# Patient Record
Sex: Female | Born: 1975 | Race: White | Hispanic: No | Marital: Single
Health system: Southern US, Community
[De-identification: ages and names within clinical notes are randomized; demographics above are authoritative.]

## PROBLEM LIST (undated history)

## (undated) DIAGNOSIS — I10 Essential (primary) hypertension: Secondary | ICD-10-CM

---

## 2018-04-26 ENCOUNTER — Emergency Department (HOSPITAL_COMMUNITY): Payer: BLUE CROSS/BLUE SHIELD

## 2018-04-26 ENCOUNTER — Emergency Department (HOSPITAL_COMMUNITY)
Admission: EM | Admit: 2018-04-26 | Discharge: 2018-04-26 | Disposition: A | Discharge status: Home / Self Care | Payer: BLUE CROSS/BLUE SHIELD | Attending: Emergency Medicine | Admitting: Emergency Medicine

## 2018-04-26 ENCOUNTER — Encounter (HOSPITAL_COMMUNITY): Payer: Self-pay | Admitting: Internal Medicine

## 2018-04-26 DIAGNOSIS — T07XXXA Unspecified multiple injuries, initial encounter: Secondary | ICD-10-CM

## 2018-04-26 DIAGNOSIS — S52502A Unspecified fracture of the lower end of left radius, initial encounter for closed fracture: Secondary | ICD-10-CM | POA: Diagnosis not present

## 2018-04-26 DIAGNOSIS — S52612A Displaced fracture of left ulna styloid process, initial encounter for closed fracture: Secondary | ICD-10-CM | POA: Diagnosis not present

## 2018-04-26 DIAGNOSIS — Y929 Unspecified place or not applicable: Secondary | ICD-10-CM | POA: Insufficient documentation

## 2018-04-26 DIAGNOSIS — S8002XA Contusion of left knee, initial encounter: Secondary | ICD-10-CM

## 2018-04-26 DIAGNOSIS — Y999 Unspecified external cause status: Secondary | ICD-10-CM | POA: Diagnosis not present

## 2018-04-26 DIAGNOSIS — Y939 Activity, unspecified: Secondary | ICD-10-CM | POA: Insufficient documentation

## 2018-04-26 DIAGNOSIS — S6992XA Unspecified injury of left wrist, hand and finger(s), initial encounter: Secondary | ICD-10-CM | POA: Diagnosis present

## 2018-04-26 DIAGNOSIS — S62102A Fracture of unspecified carpal bone, left wrist, initial encounter for closed fracture: Secondary | ICD-10-CM

## 2018-04-26 HISTORY — DX: Essential (primary) hypertension: I10

## 2018-04-26 LAB — CBC WITH DIFFERENTIAL/PLATELET
Abs Immature Granulocytes: 0.11 10*3/uL — ABNORMAL HIGH (ref 0.00–0.07)
Basophils Absolute: 0.1 10*3/uL (ref 0.0–0.1)
Basophils Relative: 0 %
Eosinophils Absolute: 0.1 10*3/uL (ref 0.0–0.5)
Eosinophils Relative: 1 %
HEMATOCRIT: 46.7 % — AB (ref 36.0–46.0)
Hemoglobin: 15.2 g/dL — ABNORMAL HIGH (ref 12.0–15.0)
Immature Granulocytes: 1 %
Lymphocytes Relative: 14 %
Lymphs Abs: 2.3 10*3/uL (ref 0.7–4.0)
MCH: 31.3 pg (ref 26.0–34.0)
MCHC: 32.5 g/dL (ref 30.0–36.0)
MCV: 96.3 fL (ref 80.0–100.0)
Monocytes Absolute: 0.8 10*3/uL (ref 0.1–1.0)
Monocytes Relative: 5 %
Neutro Abs: 13.2 10*3/uL — ABNORMAL HIGH (ref 1.7–7.7)
Neutrophils Relative %: 79 %
Platelets: 187 10*3/uL (ref 150–400)
RBC: 4.85 MIL/uL (ref 3.87–5.11)
RDW: 12.5 % (ref 11.5–15.5)
WBC: 16.5 10*3/uL — ABNORMAL HIGH (ref 4.0–10.5)
nRBC: 0 % (ref 0.0–0.2)

## 2018-04-26 LAB — BASIC METABOLIC PANEL
ANION GAP: 11 (ref 5–15)
BUN: 14 mg/dL (ref 6–20)
CALCIUM: 9.5 mg/dL (ref 8.9–10.3)
CO2: 25 mmol/L (ref 22–32)
Chloride: 99 mmol/L (ref 98–111)
Creatinine, Ser: 0.91 mg/dL (ref 0.44–1.00)
GFR calc non Af Amer: >60 mL/min (ref >60)
Glucose, Bld: 156 mg/dL — ABNORMAL HIGH (ref 70–99)
Potassium: 3.5 mmol/L (ref 3.5–5.1)
Sodium: 135 mmol/L (ref 135–145)

## 2018-04-26 LAB — I-STAT BETA HCG BLOOD, ED (MC, WL, AP ONLY): I-stat hCG, quantitative: <5 m[IU]/mL (ref <5)

## 2018-04-26 MED ORDER — MORPHINE SULFATE (PF) 4 MG/ML IV SOLN: 4.0000 mg | Freq: Once | INTRAVENOUS | Status: DC

## 2018-04-26 MED ORDER — ONDANSETRON 4 MG PO TBDP
8.0000 mg | ORAL_TABLET | Freq: Once | ORAL | Status: AC
  Administered 2018-04-26: 8 mg via ORAL
  Filled 2018-04-26: qty 2

## 2018-04-26 MED ORDER — ONDANSETRON HCL 8 MG PO TABS: 8.0000 mg | ORAL_TABLET | Freq: Three times a day (TID) | ORAL | 0 refills | Status: AC | PRN

## 2018-04-26 MED ORDER — HYDROCODONE-ACETAMINOPHEN 5-325 MG PO TABS: 1.0000 | ORAL_TABLET | ORAL | 0 refills | Status: AC | PRN

## 2018-04-26 MED ORDER — HYDROCODONE-ACETAMINOPHEN 5-325 MG PO TABS
2.0000 | ORAL_TABLET | Freq: Once | ORAL | Status: AC
  Administered 2018-04-26: 2 via ORAL
  Filled 2018-04-26: qty 2

## 2018-04-26 MED ORDER — MORPHINE SULFATE (PF) 4 MG/ML IV SOLN
4.0000 mg | Freq: Once | INTRAVENOUS | Status: AC
  Administered 2018-04-26: 4 mg via INTRAVENOUS
  Filled 2018-04-26: qty 1

## 2018-04-26 NOTE — ED Notes (Signed)
ED Provider at bedside. 

## 2018-04-26 NOTE — ED Notes (Signed)
Patient Alert and oriented to baseline. Stable and ambulatory to baseline. Patient verbalized understanding of the discharge instructions.  Patient belongings were taken by the patient.   
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
## 2018-04-26 NOTE — Progress Notes (Signed)
Orthopedic Tech Progress Note Patient Details:  Latasha Kelly 12-31-1975 [id number]  Ortho Devices Type of Ortho Device: Arm sling, Sugartong splint Ortho Device/Splint Location: lue Ortho Device/Splint Interventions: Ordered, Application, Adjustment   Post Interventions Patient Tolerated: Well Instructions Provided: Care of device, Adjustment of device   Trinna Post 04/26/2018, 9:03 PM

## 2018-04-26 NOTE — ED Notes (Signed)
Patient transported to X-ray 

## 2019-03-29 ENCOUNTER — Other Ambulatory Visit: Payer: Self-pay | Admitting: Obstetrics and Gynecology

## 2019-03-29 DIAGNOSIS — N63 Unspecified lump in unspecified breast: Secondary | ICD-10-CM

## 2019-04-17 ENCOUNTER — Ambulatory Visit
Admission: RE | Admit: 2019-04-17 | Discharge: 2019-04-17 | Disposition: A | Discharge status: Home / Self Care | Payer: BC Managed Care – PPO | Source: Ambulatory Visit | Attending: Obstetrics and Gynecology | Admitting: Obstetrics and Gynecology

## 2019-04-17 ENCOUNTER — Other Ambulatory Visit: Payer: Self-pay

## 2019-04-17 ENCOUNTER — Other Ambulatory Visit: Payer: Self-pay | Admitting: Obstetrics and Gynecology

## 2019-04-17 ENCOUNTER — Ambulatory Visit: Payer: Self-pay

## 2019-04-17 DIAGNOSIS — R928 Other abnormal and inconclusive findings on diagnostic imaging of breast: Secondary | ICD-10-CM

## 2019-04-17 DIAGNOSIS — N6320 Unspecified lump in the left breast, unspecified quadrant: Secondary | ICD-10-CM

## 2019-10-17 ENCOUNTER — Ambulatory Visit
Admission: RE | Admit: 2019-10-17 | Discharge: 2019-10-17 | Disposition: A | Discharge status: Home / Self Care | Payer: BC Managed Care – PPO | Source: Ambulatory Visit | Attending: Obstetrics and Gynecology | Admitting: Obstetrics and Gynecology

## 2019-10-17 ENCOUNTER — Other Ambulatory Visit: Payer: Self-pay | Admitting: Obstetrics and Gynecology

## 2019-10-17 ENCOUNTER — Other Ambulatory Visit: Payer: Self-pay

## 2019-10-17 DIAGNOSIS — N6320 Unspecified lump in the left breast, unspecified quadrant: Secondary | ICD-10-CM

## 2020-04-19 ENCOUNTER — Other Ambulatory Visit: Payer: Self-pay

## 2020-04-19 ENCOUNTER — Ambulatory Visit: Payer: BC Managed Care – PPO

## 2020-04-19 ENCOUNTER — Ambulatory Visit
Admission: RE | Admit: 2020-04-19 | Discharge: 2020-04-19 | Disposition: A | Discharge status: Home / Self Care | Payer: 59 | Source: Ambulatory Visit | Attending: Obstetrics and Gynecology | Admitting: Obstetrics and Gynecology

## 2020-04-19 DIAGNOSIS — N6320 Unspecified lump in the left breast, unspecified quadrant: Secondary | ICD-10-CM

## 2021-11-17 IMAGING — MG DIGITAL DIAGNOSTIC BILAT W/ TOMO W/ CAD
8 series · 8 of 24 positions shown · non-contrast
Comparison: Baseline screening mammogram dated 03/15/2019, LEFT
breast diagnostic mammogram and ultrasound dated 04/17/2019, and
LEFT breast ultrasound dated 10/17/2019.

CLINICAL DATA: Follow-up for probably benign mass in the LEFT
breast. This probably benign mass was initially identified on
baseline screening mammogram dated 03/15/2019.

EXAM:
DIGITAL DIAGNOSTIC BILATERAL MAMMOGRAM WITH TOMO AND CAD

[R MLO synth-2D]
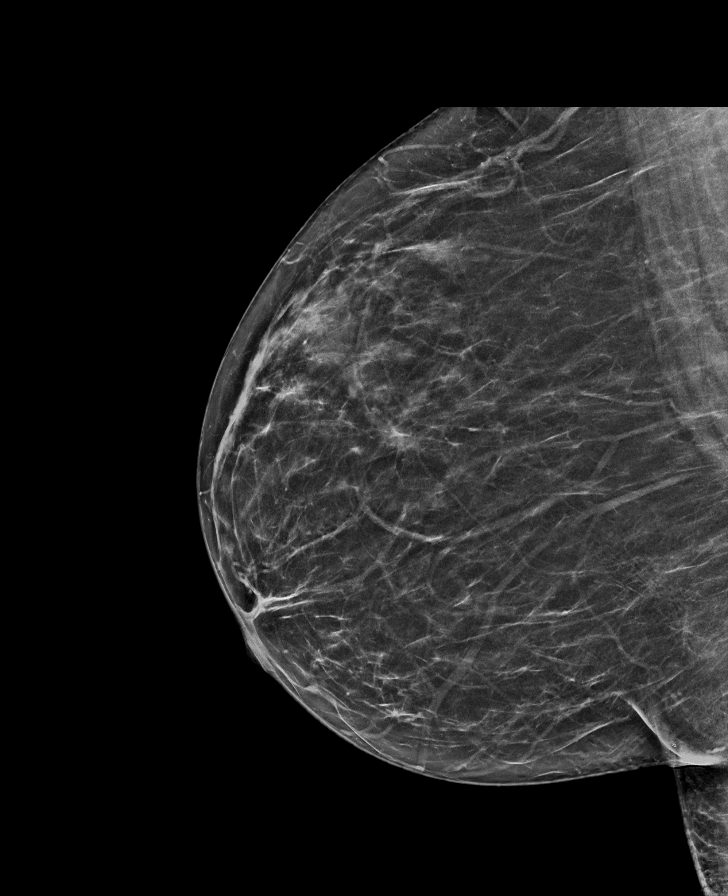

[L MLO synth-2D]
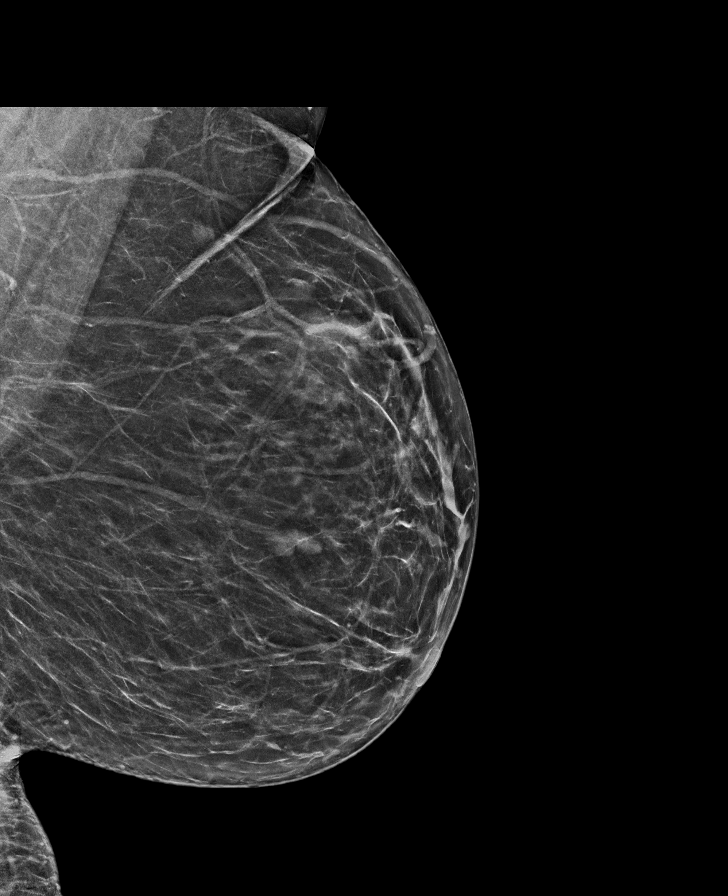

[R CC synth-2D]
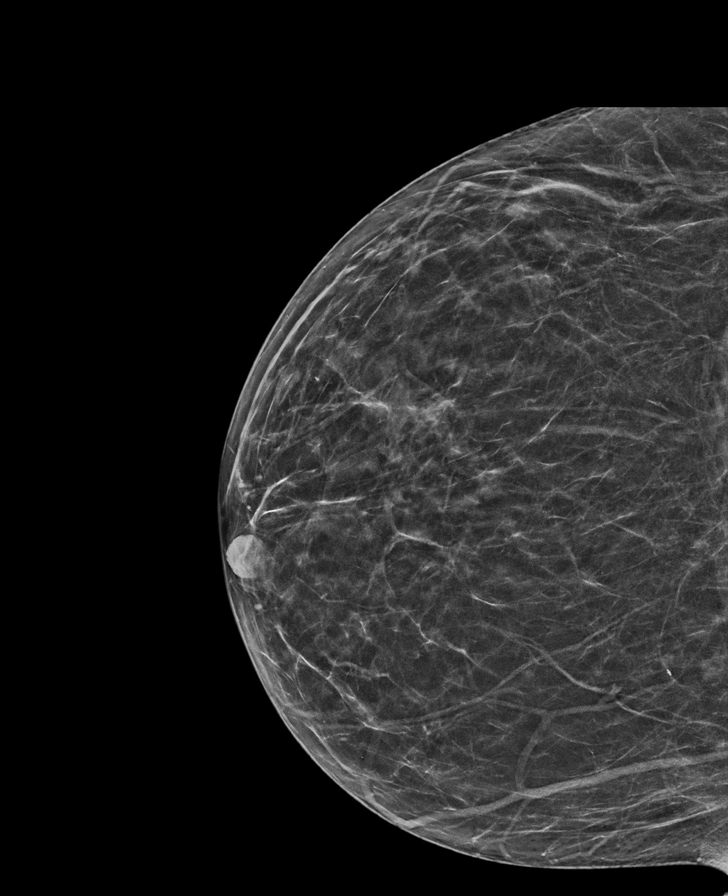

[L CC synth-2D]
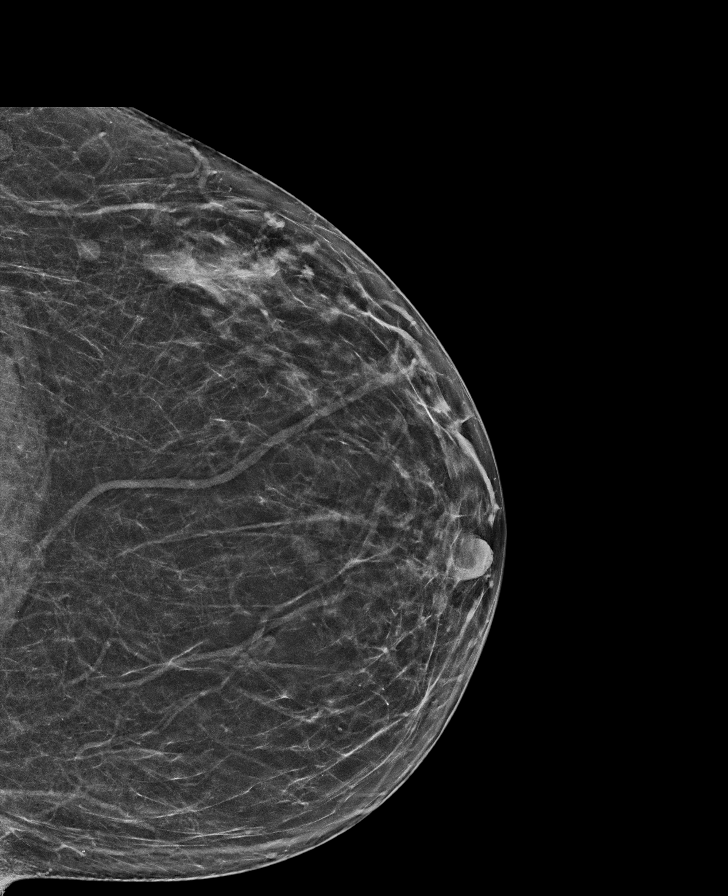

[R CC tomo · tomo slice 31/62.0]
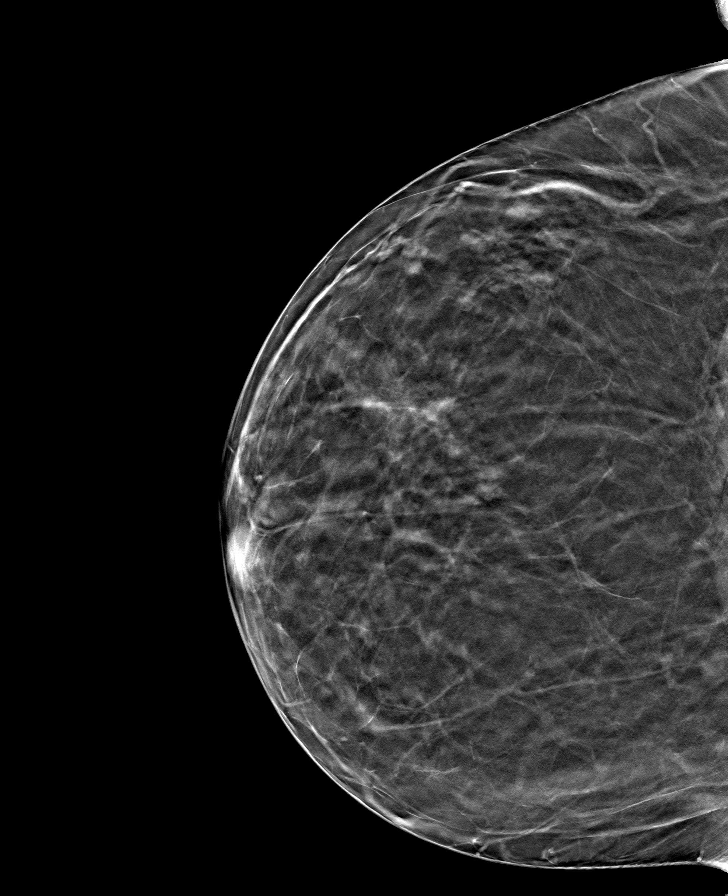

[R MLO tomo · tomo slice 36/71.0]
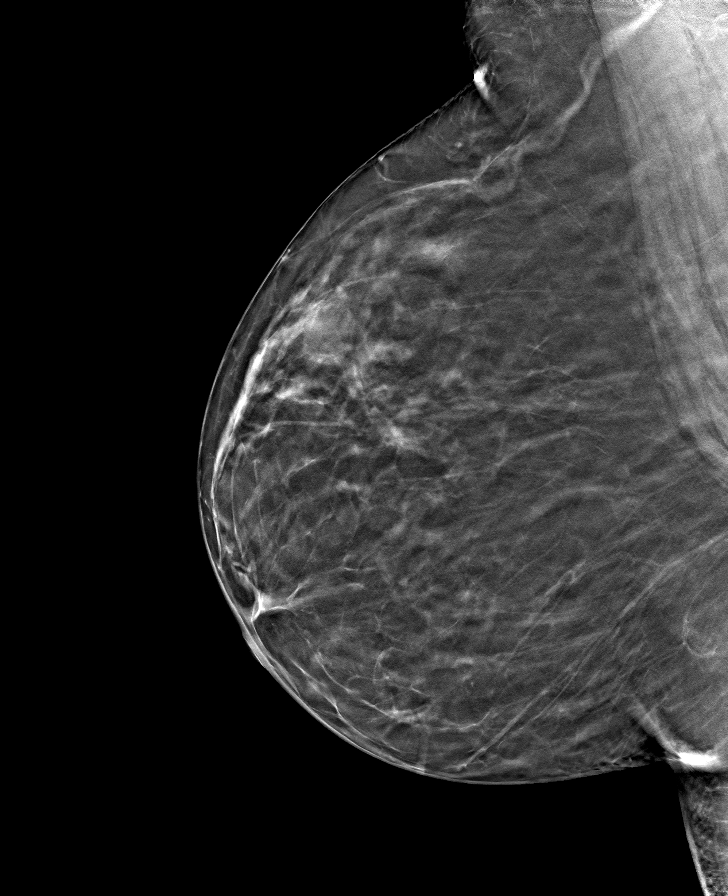

[L MLO tomo · tomo slice 36/71.0]
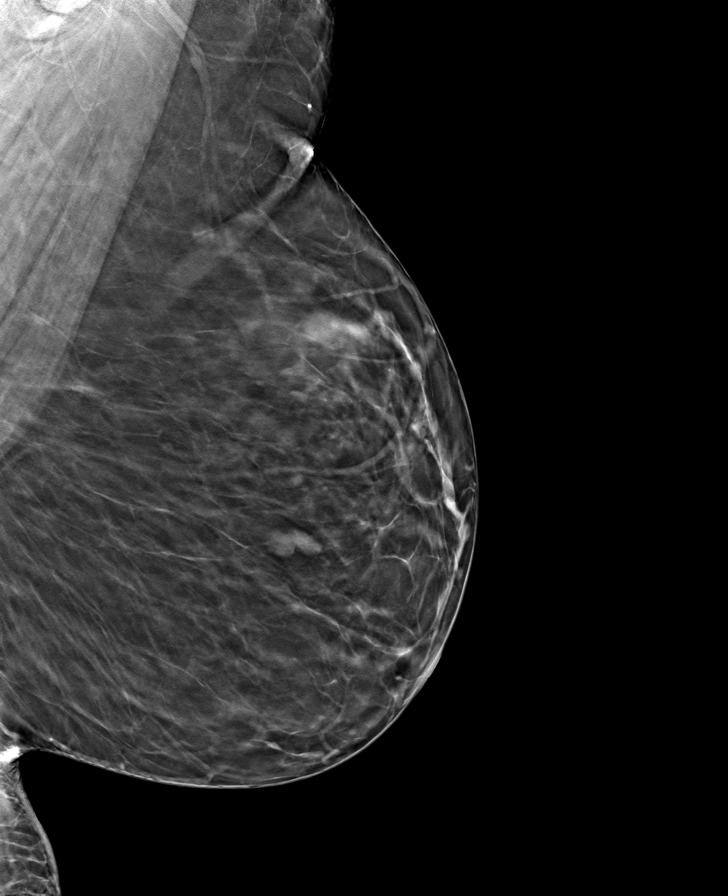

[L CC tomo · tomo slice 31/62.0]
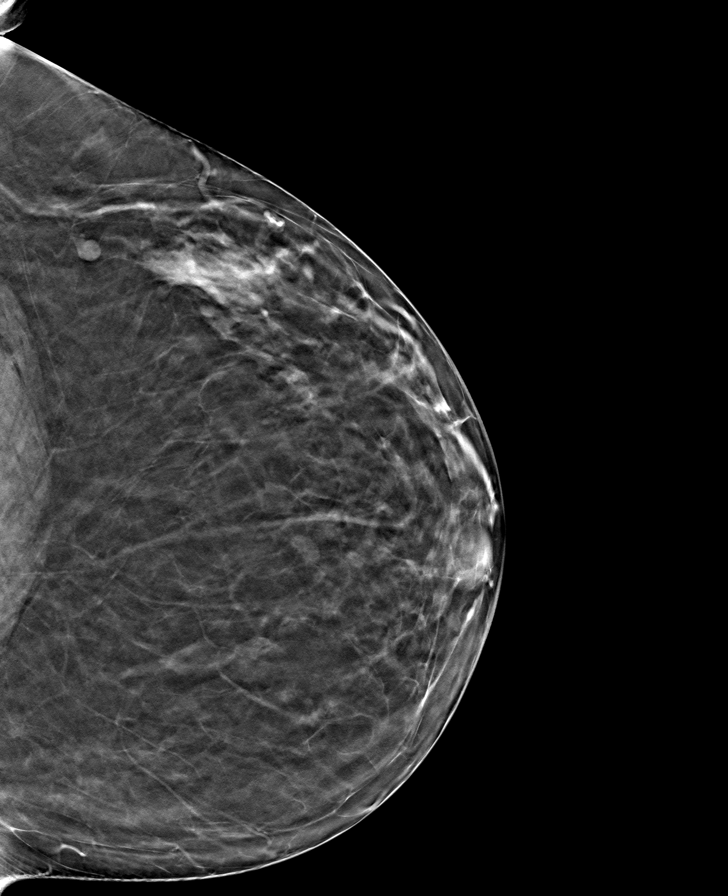

[8 of 24 positions shown; findings below may reference images not displayed]

ACR Breast Density Category b: There are scattered areas of
fibroglandular density.
FINDINGS: The oval circumscribed low-density mass within the upper LEFT
breast, 12 o'clock axis, is unchanged in size or appearance. There
are no new dominant masses, suspicious calcifications or secondary
signs of malignancy within either breast.

Mammographic images were processed with CAD.
IMPRESSION: 1. Stable probably benign mass within the upper LEFT breast, 12
o'clock axis, most likely a benign fibroadenoma based on previous
ultrasound. Recommend additional follow-up diagnostic mammogram, and
possible ultrasound, in 12 months to ensure 2 year stability.
2. No evidence of malignancy within the RIGHT breast.

RECOMMENDATION:
Bilateral diagnostic mammogram, and possible LEFT breast ultrasound,
in 12 months.

I have discussed the findings and recommendations with the patient.
If applicable, a reminder letter will be sent to the patient
regarding the next appointment.

BI-RADS CATEGORY  3: Probably benign.
